# Patient Record
Sex: Male | Born: 2000 | Race: White | Hispanic: No | Marital: Single | Smoking: Never smoker
Health system: Southern US, Community
[De-identification: ages and names within clinical notes are randomized; demographics above are authoritative.]

---
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
## 2022-03-13 ENCOUNTER — Ambulatory Visit
Admission: RE | Admit: 2022-03-13 | Discharge: 2022-03-13 | Disposition: A | Discharge status: Home / Self Care | Payer: No Typology Code available for payment source | Source: Ambulatory Visit | Attending: Family Medicine | Admitting: Family Medicine

## 2022-03-13 ENCOUNTER — Other Ambulatory Visit: Payer: Self-pay | Admitting: Family Medicine

## 2022-03-13 DIAGNOSIS — R059 Cough, unspecified: Secondary | ICD-10-CM

## 2022-07-10 ENCOUNTER — Ambulatory Visit (INDEPENDENT_AMBULATORY_CARE_PROVIDER_SITE_OTHER): Payer: No Typology Code available for payment source | Admitting: Family Medicine

## 2022-07-10 ENCOUNTER — Telehealth: Payer: Self-pay | Admitting: Family Medicine

## 2022-07-10 ENCOUNTER — Ambulatory Visit (HOSPITAL_BASED_OUTPATIENT_CLINIC_OR_DEPARTMENT_OTHER)
Admission: RE | Admit: 2022-07-10 | Discharge: 2022-07-10 | Disposition: A | Discharge status: Home / Self Care | Payer: No Typology Code available for payment source | Source: Ambulatory Visit | Attending: Family Medicine | Admitting: Family Medicine

## 2022-07-10 ENCOUNTER — Encounter: Payer: Self-pay | Admitting: Family Medicine

## 2022-07-10 VITALS — BP 112/64 | Ht 79.0 in | Wt 205.0 lb

## 2022-07-10 DIAGNOSIS — S39012A Strain of muscle, fascia and tendon of lower back, initial encounter: Secondary | ICD-10-CM | POA: Insufficient documentation

## 2022-07-10 NOTE — Patient Instructions (Signed)
Nice to meet you Please try heat as needed  Please try the exercises  We'll call with the xray results.  Please send me a message in MyChart with any questions or updates.  Please see me back to have custom orthotics made.   --Dr. Jordan Likes

## 2022-07-10 NOTE — Telephone Encounter (Signed)
Informed of results.   [name], MD [facility] 07/10/2022, 3:37 PM

## 2022-07-10 NOTE — Assessment & Plan Note (Signed)
Acute on chronic in nature. Likely a component of his tarsal coalition playing a role.  - counseled on home exercise therapy and supportive care - xray of lumbar and pelvis  - green sport insoles and scaphoid pad - could consider custom orthotics.

## 2022-07-10 NOTE — Progress Notes (Signed)
  Nathan Austin - 21 y.o. male MRN [id number]  Date of birth: [birth year]  SUBJECTIVE:  Including CC & ROS.  No chief complaint on file.   Nathan Austin is a 21 y.o. male that is  presenting with acute on chronic low back pain. Localized to the lower back. 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 Has had corrective surgery on the right. No history of back surgery.   Review of Systems See HPI   HISTORY: Past Medical, Surgical, Social, and Family History Reviewed & Updated per EMR.   Pertinent Historical Findings include:  History reviewed. No pertinent past medical history.  History reviewed. No pertinent surgical history.   PHYSICAL EXAM:  VS: BP 112/64   Ht 6\' 7"  (2.007 m)   Wt 205 lb (93 kg)   BMI 23.09 kg/m  Physical Exam Gen: NAD, alert, cooperative with exam, well-appearing MSK:  Neurovascularly intact       ASSESSMENT & PLAN:   Strain of lumbar region Acute on chronic in nature. Likely a component of his tarsal coalition playing a role.  - counseled on home exercise therapy and supportive care - xray of lumbar and pelvis  - green sport insoles and scaphoid pad - could consider custom orthotics.

## 2022-12-05 ENCOUNTER — Encounter: Payer: Self-pay | Admitting: *Deleted
